# Patient Record
Sex: Male | Born: 1975 | Race: Black or African American | Hispanic: No | Marital: Single | Smoking: Current some day smoker
Health system: Southern US, Community
[De-identification: ages and names within clinical notes are randomized; demographics above are authoritative.]

## PROBLEM LIST (undated history)

## (undated) DIAGNOSIS — F419 Anxiety disorder, unspecified: Secondary | ICD-10-CM

---

## 2016-06-20 ENCOUNTER — Emergency Department
Admission: EM | Admit: 2016-06-20 | Discharge: 2016-06-20 | Disposition: A | Discharge status: Home / Self Care | Payer: Self-pay | Attending: Emergency Medicine | Admitting: Emergency Medicine

## 2016-06-20 ENCOUNTER — Encounter: Payer: Self-pay | Admitting: *Deleted

## 2016-06-20 ENCOUNTER — Emergency Department: Payer: Self-pay

## 2016-06-20 DIAGNOSIS — F17200 Nicotine dependence, unspecified, uncomplicated: Secondary | ICD-10-CM | POA: Insufficient documentation

## 2016-06-20 DIAGNOSIS — F410 Panic disorder [episodic paroxysmal anxiety] without agoraphobia: Secondary | ICD-10-CM | POA: Insufficient documentation

## 2016-06-20 DIAGNOSIS — I10 Essential (primary) hypertension: Secondary | ICD-10-CM | POA: Insufficient documentation

## 2016-06-20 HISTORY — DX: Anxiety disorder, unspecified: F41.9

## 2016-06-20 LAB — CBC
HEMATOCRIT: 38 % — AB (ref 40.0–52.0)
Hemoglobin: 13.2 g/dL (ref 13.0–18.0)
MCH: 28.6 pg (ref 26.0–34.0)
MCHC: 34.7 g/dL (ref 32.0–36.0)
MCV: 82.5 fL (ref 80.0–100.0)
Platelets: 263 10*3/uL (ref 150–440)
RBC: 4.61 MIL/uL (ref 4.40–5.90)
RDW: 13.3 % (ref 11.5–14.5)
WBC: 7 10*3/uL (ref 3.8–10.6)

## 2016-06-20 LAB — BASIC METABOLIC PANEL
Anion gap: 8 (ref 5–15)
BUN: 12 mg/dL (ref 6–20)
CHLORIDE: 107 mmol/L (ref 101–111)
CO2: 26 mmol/L (ref 22–32)
Calcium: 9.7 mg/dL (ref 8.9–10.3)
Creatinine, Ser: 0.96 mg/dL (ref 0.61–1.24)
GFR calc Af Amer: >60 mL/min (ref >60)
GFR calc non Af Amer: >60 mL/min (ref >60)
GLUCOSE: 116 mg/dL — AB (ref 65–99)
POTASSIUM: 4 mmol/L (ref 3.5–5.1)
Sodium: 141 mmol/L (ref 135–145)

## 2016-06-20 LAB — TROPONIN I: Troponin I: <0.03 ng/mL (ref <0.03)

## 2016-06-20 MED ORDER — LORAZEPAM 1 MG PO TABS: 1.0000 mg | ORAL_TABLET | Freq: Two times a day (BID) | ORAL | Status: AC

## 2016-06-20 MED ORDER — HYDROCHLOROTHIAZIDE 25 MG PO TABS: 25.0000 mg | ORAL_TABLET | Freq: Every day | ORAL | Status: AC

## 2016-06-20 NOTE — ED Notes (Signed)
States he recently moved to the area and has been under a lot of stress with his mother, states hx of anxiety, at present pt very anxious, states blurry vision and SOB and sweaty, family with pt

## 2016-06-20 NOTE — Discharge Instructions (Signed)
Hypertension  Hypertension, commonly called high blood pressure, is when the force of blood pumping through your arteries is too strong. Your arteries are the blood vessels that carry blood from your heart throughout your body. A blood pressure reading consists of a higher number over a lower number, such as 110/72. The higher number (systolic) is the pressure inside your arteries when your heart pumps. The lower number (diastolic) is the pressure inside your arteries when your heart relaxes. Ideally you want your blood pressure below 120/80.  Hypertension forces your heart to work harder to pump blood. Your arteries may become narrow or stiff. Having untreated or uncontrolled hypertension can cause heart attack, stroke, kidney disease, and other problems.  RISK FACTORS  Some risk factors for high blood pressure are controllable. Others are not.   Risk factors you cannot control include:   · Race. You may be at higher risk if you are African American.  · Age. Risk increases with age.  · Gender. Men are at higher risk than women before age 45 years. After age 65, women are at higher risk than men.  Risk factors you can control include:  · Not getting enough exercise or physical activity.  · Being overweight.  · Getting too much fat, sugar, calories, or salt in your diet.  · Drinking too much alcohol.  SIGNS AND SYMPTOMS  Hypertension does not usually cause signs or symptoms. Extremely high blood pressure (hypertensive crisis) may cause headache, anxiety, shortness of breath, and nosebleed.  DIAGNOSIS  To check if you have hypertension, your health care provider will measure your blood pressure while you are seated, with your arm held at the level of your heart. It should be measured at least twice using the same arm. Certain conditions can cause a difference in blood pressure between your right and left arms. A blood pressure reading that is higher than normal on one occasion does not mean that you need treatment. If  it is not clear whether you have high blood pressure, you may be asked to return on a different day to have your blood pressure checked again. Or, you may be asked to monitor your blood pressure at home for 1 or more weeks.  TREATMENT  Treating high blood pressure includes making lifestyle changes and possibly taking medicine. Living a healthy lifestyle can help lower high blood pressure. You may need to change some of your habits.  Lifestyle changes may include:  · Following the DASH diet. This diet is high in fruits, vegetables, and whole grains. It is low in salt, red meat, and added sugars.  · Keep your sodium intake below 2,300 mg per day.  · Getting at least 30-45 minutes of aerobic exercise at least 4 times per week.  · Losing weight if necessary.  · Not smoking.  · Limiting alcoholic beverages.  · Learning ways to reduce stress.  Your health care provider may prescribe medicine if lifestyle changes are not enough to get your blood pressure under control, and if one of the following is true:  · You are 18-59 years of age and your systolic blood pressure is above 140.  · You are 60 years of age or older, and your systolic blood pressure is above 150.  · Your diastolic blood pressure is above 90.  · You have diabetes, and your systolic blood pressure is over 140 or your diastolic blood pressure is over 90.  · You have kidney disease and your blood pressure is above   140/90.  · You have heart disease and your blood pressure is above 140/90.  Your personal target blood pressure may vary depending on your medical conditions, your age, and other factors.  HOME CARE INSTRUCTIONS  · Have your blood pressure rechecked as directed by your health care provider.    · Take medicines only as directed by your health care provider. Follow the directions carefully. Blood pressure medicines must be taken as prescribed. The medicine does not work as well when you skip doses. Skipping doses also puts you at risk for  problems.  · Do not smoke.    · Monitor your blood pressure at home as directed by your health care provider.   SEEK MEDICAL CARE IF:   · You think you are having a reaction to medicines taken.  · You have recurrent headaches or feel dizzy.  · You have swelling in your ankles.  · You have trouble with your vision.  SEEK IMMEDIATE MEDICAL CARE IF:  · You develop a severe headache or confusion.  · You have unusual weakness, numbness, or feel faint.  · You have severe chest or abdominal pain.  · You vomit repeatedly.  · You have trouble breathing.  MAKE SURE YOU:   · Understand these instructions.  · Will watch your condition.  · Will get help right away if you are not doing well or get worse.     This information is not intended to replace advice given to you by your health care provider. Make sure you discuss any questions you have with your health care provider.     Document Released: 12/10/2005 Document Revised: 04/26/2015 Document Reviewed: 10/02/2013  Elsevier Interactive Patient Education ©2016 [employer]    Panic Attacks  Panic attacks are sudden, short-lived surges of severe anxiety, fear, or discomfort. They may occur for no reason when you are relaxed, when you are anxious, or when you are sleeping. Panic attacks may occur for a number of reasons:   · Healthy people occasionally have panic attacks in extreme, life-threatening situations, such as war or natural disasters. Normal anxiety is a protective mechanism of the body that helps us react to danger (fight or flight response).  · Panic attacks are often seen with anxiety disorders, such as panic disorder, social anxiety disorder, generalized anxiety disorder, and phobias. Anxiety disorders cause excessive or uncontrollable anxiety. They may interfere with your relationships or other life activities.  · Panic attacks are sometimes seen with other mental illnesses, such as depression and posttraumatic stress disorder.  · Certain medical conditions,  prescription medicines, and drugs of abuse can cause panic attacks.  SYMPTOMS   Panic attacks start suddenly, peak within 20 minutes, and are accompanied by four or more of the following symptoms:  · Pounding heart or fast heart rate (palpitations).  · Sweating.  · Trembling or shaking.  · Shortness of breath or feeling smothered.  · Feeling choked.  · Chest pain or discomfort.  · Nausea or strange feeling in your stomach.  · Dizziness, light-headedness, or feeling like you will faint.  · Chills or hot flushes.  · Numbness or tingling in your lips or hands and feet.  · Feeling that things are not real or feeling that you are not yourself.  · Fear of losing control or going crazy.  · Fear of dying.  Some of these symptoms can mimic serious medical conditions. For example, you may think you are having a heart attack. Although panic attacks can be very scary, they   are not life threatening.  DIAGNOSIS   Panic attacks are diagnosed through an assessment by your health care provider. Your health care provider will ask questions about your symptoms, such as where and when they occurred. Your health care provider will also ask about your medical history and use of alcohol and drugs, including prescription medicines. Your health care provider may order blood tests or other studies to rule out a serious medical condition. Your health care provider may refer you to a mental health professional for further evaluation.  TREATMENT   · Most healthy people who have one or two panic attacks in an extreme, life-threatening situation will not require treatment.  · The treatment for panic attacks associated with anxiety disorders or other mental illness typically involves counseling with a mental health professional, medicine, or a combination of both. Your health care provider will help determine what treatment is best for you.  · Panic attacks due to physical illness usually go away with treatment of the illness. If prescription  medicine is causing panic attacks, talk with your health care provider about stopping the medicine, decreasing the dose, or substituting another medicine.  · Panic attacks due to alcohol or drug abuse go away with abstinence. Some adults need professional help in order to stop drinking or using drugs.  HOME CARE INSTRUCTIONS   · Take all medicines as directed by your health care provider.    · Schedule and attend follow-up visits as directed by your health care provider. It is important to keep all your appointments.  SEEK MEDICAL CARE IF:  · You are not able to take your medicines as prescribed.  · Your symptoms do not improve or get worse.  SEEK IMMEDIATE MEDICAL CARE IF:   · You experience panic attack symptoms that are different than your usual symptoms.  · You have serious thoughts about hurting yourself or others.  · You are taking medicine for panic attacks and have a serious side effect.  MAKE SURE YOU:  · Understand these instructions.  · Will watch your condition.  · Will get help right away if you are not doing well or get worse.     This information is not intended to replace advice given to you by your health care provider. Make sure you discuss any questions you have with your health care provider.     Document Released: 12/10/2005 Document Revised: 12/15/2013 Document Reviewed: 07/24/2013  Elsevier Interactive Patient Education ©2016 [employer]

## 2016-06-20 NOTE — ED Provider Notes (Signed)
[facility] Emergency Department Provider Note        Time seen: ----------------------------------------- 2:30 PM on 06/20/2016 -----------------------------------------    I have reviewed the triage vital signs and the nursing notes.   HISTORY  Chief Complaint Anxiety    HPI Dennis Deleon is a 40 y.o. male who presents to ER stating he has been under a lot of stress. Patient feels like is having a panic attack. He recently moved to the area and presents very anxious. He has a history of blurry vision and shortness of breath. He also felt sweaty. Typically he distressed, down and slows breathing rate down. He denies any recent illness or other medical complaints.   Past Medical History  Diagnosis Date  . Anxiety     There are no active problems to display for this patient.   History reviewed. No pertinent past surgical history.  Allergies Review of patient's allergies indicates no known allergies.  Social History Social History  Substance Use Topics  . Smoking status: Current Some Day Smoker  . Smokeless tobacco: None  . Alcohol Use: None   Review of Systems Constitutional: Negative for fever. Eyes: Positive for blurry vision ENT: Negative for sore throat. Cardiovascular: Negative for chest pain. Respiratory: Positive for shortness of breath Gastrointestinal: Negative for abdominal pain, vomiting and diarrhea. Genitourinary: Negative for dysuria. Musculoskeletal: Negative for back pain. Skin: Negative for rash. Neurological: Negative for headaches, focal weakness or numbness. Psychiatric: Positive for anxiety  10-point ROS otherwise negative.  ____________________________________________   PHYSICAL EXAM:  VITAL SIGNS: ED Triage Vitals  Enc Vitals Group     BP 06/20/16 1412 166/107 mmHg     Pulse Rate 06/20/16 1412 78     Resp 06/20/16 1412 26     Temp 06/20/16 1412 98.9 F (37.2 C)     Temp Source 06/20/16 1412 Oral      SpO2 06/20/16 1412 100 %     Weight 06/20/16 1412 167 lb (75.751 kg)     Height 06/20/16 1412 5\' 6"  (1.676 m)     Head Cir --      Peak Flow --      Pain Score 06/20/16 1412 3     Pain Loc --      Pain Edu? --      Excl. in GC? --     Constitutional: Alert and oriented. Anxious Eyes: Conjunctivae are normal. PERRL. Normal extraocular movements. ENT   Head: Normocephalic and atraumatic.   Nose: No congestion/rhinnorhea.   Mouth/Throat: Mucous membranes are moist.   Neck: No stridor. Cardiovascular: Normal rate, regular rhythm. No murmurs, rubs, or gallops. Respiratory: Mild tachypnea with clear breath sounds Gastrointestinal: Soft and nontender. Normal bowel sounds Musculoskeletal: Nontender with normal range of motion in all extremities. No lower extremity tenderness nor edema. Neurologic:  Normal speech and language. No gross focal neurologic deficits are appreciated.  Skin:  Skin is warm, dry and intact. No rash noted. Psychiatric: Anxious mood and affect ____________________________________________  EKG: Interpreted by me. Sinus rhythm with a rate of 71 bpm, normal PR interval, normal QRS, normal QT interval. Leftward axis, LVH.  ____________________________________________  ED COURSE:  Pertinent labs & imaging results that were available during my care of the patient were reviewed by me and considered in my medical decision making (see chart for details). Patient presents to ER with what is likely anxiety. I will check basic labs and reevaluate. Patient has declined anxiolytics at this time. ____________________________________________    Vickie EpleyLABS (  pertinent positives/negatives)  Labs Reviewed  BASIC METABOLIC PANEL - Abnormal; Notable for the following:    Glucose, Bld 116 (*)    All other components within normal limits  CBC - Abnormal; Notable for the following:    HCT 38.0 (*)    All other components within normal limits  TROPONIN I     RADIOLOGY  Chest x-ray IMPRESSION: No edema or consolidation.  ____________________________________________  FINAL ASSESSMENT AND PLAN  Panic attacks, Hypertension  Plan: Patient with labs and imaging as dictated above. Patient is in no acute distress, panic attack appears to be resolved. He likely does have underlying hypertension and I will start him on HCTZ. I will also prescribe Ativan he can use as needed. [facility] to [facility].   Emily FilbertWilliams, Jonathan E, MD   Note: This dictation was prepared with Dragon dictation. Any transcriptional errors that result from this process are unintentional   Emily FilbertJonathan E Williams, MD 06/20/16 1530

## 2017-03-15 IMAGING — DX DG CHEST 1V PORT
1 series · 1 of 1 positions shown · non-contrast
Comparison: None.

CLINICAL DATA: Shortness of breath and blurred vision

EXAM:
PORTABLE CHEST 1 VIEW

[chest ap]
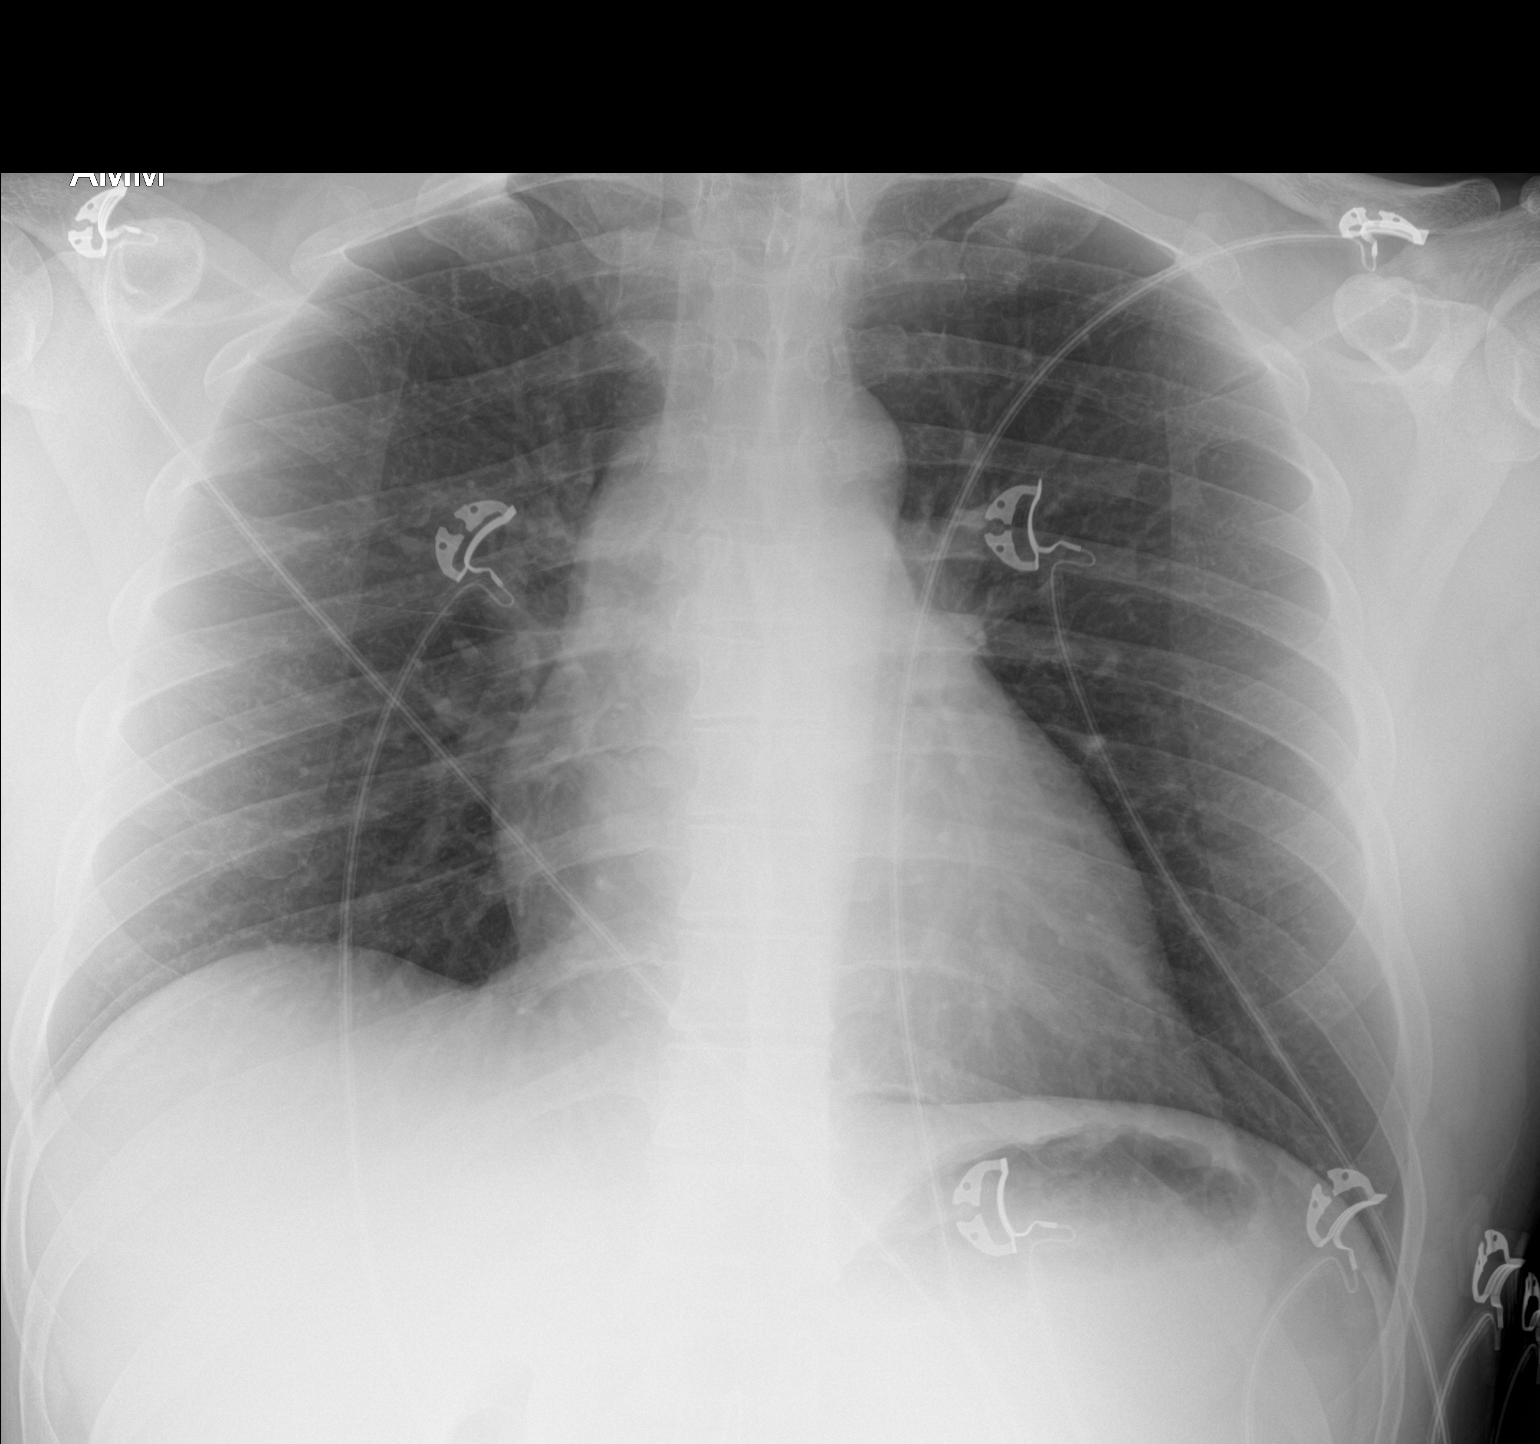

[1 of 1 positions shown; findings below may reference images not displayed]

FINDINGS: Lungs are clear. Heart size and pulmonary vascularity are normal. No
pneumothorax. No adenopathy. No bone lesions.
IMPRESSION: No edema or consolidation.
